# Patient Record
Sex: Male | Born: 1990 | Race: White | Hispanic: No | Marital: Married | Smoking: Current every day smoker
Health system: Southern US, Community
[De-identification: ages and names within clinical notes are randomized; demographics above are authoritative.]

## PROBLEM LIST (undated history)

## (undated) HISTORY — PX: VASECTOMY: SHX75

## (undated) HISTORY — PX: TONSILLECTOMY: SUR1361

## (undated) HISTORY — PX: ARTHROSCOPIC REPAIR ACL: SUR80

---
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
## 2008-10-31 ENCOUNTER — Emergency Department: Payer: Self-pay | Admitting: Emergency Medicine

## 2009-04-27 ENCOUNTER — Emergency Department: Payer: Self-pay | Admitting: Internal Medicine

## 2009-08-14 ENCOUNTER — Emergency Department (HOSPITAL_COMMUNITY): Admission: EM | Admit: 2009-08-14 | Discharge: 2009-08-15 | Payer: Self-pay | Admitting: Emergency Medicine
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
## 2010-01-25 ENCOUNTER — Emergency Department: Payer: Self-pay | Admitting: Emergency Medicine

## 2010-02-05 ENCOUNTER — Ambulatory Visit: Payer: Self-pay | Admitting: Unknown Physician Specialty

## 2011-01-27 LAB — URINALYSIS, ROUTINE W REFLEX MICROSCOPIC
Bilirubin Urine: NEGATIVE
Ketones, ur: NEGATIVE mg/dL
Specific Gravity, Urine: 1.01 (ref 1.005–1.030)

## 2011-01-27 LAB — POCT I-STAT, CHEM 8
Calcium, Ion: 1.1 mmol/L — ABNORMAL LOW (ref 1.12–1.32)
Chloride: 105 mEq/L (ref 96–112)
Creatinine, Ser: 0.9 mg/dL (ref 0.4–1.5)
Glucose, Bld: 97 mg/dL (ref 70–99)
Hemoglobin: 15.3 g/dL (ref 13.0–17.0)
Potassium: 3.4 mEq/L — ABNORMAL LOW (ref 3.5–5.1)
TCO2: 23 mmol/L (ref 0–100)

## 2014-06-30 ENCOUNTER — Emergency Department: Payer: Self-pay | Admitting: Emergency Medicine
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
## 2015-04-30 ENCOUNTER — Encounter (HOSPITAL_COMMUNITY): Payer: Self-pay | Admitting: Emergency Medicine

## 2015-04-30 ENCOUNTER — Emergency Department (HOSPITAL_COMMUNITY)
Admission: EM | Admit: 2015-04-30 | Discharge: 2015-04-30 | Disposition: A | Discharge status: Home / Self Care | Payer: BLUE CROSS/BLUE SHIELD | Attending: Emergency Medicine | Admitting: Emergency Medicine

## 2015-04-30 DIAGNOSIS — I889 Nonspecific lymphadenitis, unspecified: Secondary | ICD-10-CM | POA: Diagnosis not present

## 2015-04-30 DIAGNOSIS — Z720 Tobacco use: Secondary | ICD-10-CM | POA: Diagnosis not present

## 2015-04-30 DIAGNOSIS — R1903 Right lower quadrant abdominal swelling, mass and lump: Secondary | ICD-10-CM | POA: Diagnosis present

## 2015-04-30 MED ORDER — IBUPROFEN 800 MG PO TABS: 800.0000 mg | ORAL_TABLET | Freq: Three times a day (TID) | ORAL | Status: DC

## 2015-04-30 MED ORDER — DOXYCYCLINE HYCLATE 100 MG PO CAPS: 100.0000 mg | ORAL_CAPSULE | Freq: Two times a day (BID) | ORAL | Status: DC

## 2015-04-30 MED ORDER — IBUPROFEN 800 MG PO TABS
800.0000 mg | ORAL_TABLET | Freq: Once | ORAL | Status: AC
  Administered 2015-04-30: 800 mg via ORAL
  Filled 2015-04-30: qty 1

## 2015-04-30 MED ORDER — DOXYCYCLINE HYCLATE 100 MG PO TABS
100.0000 mg | ORAL_TABLET | Freq: Once | ORAL | Status: AC
  Administered 2015-04-30: 100 mg via ORAL
  Filled 2015-04-30: qty 1

## 2015-04-30 NOTE — ED Notes (Signed)
Pt states that he noticed a small lump in his rt groin area - states that he can feel a constant pain - radiates down into testicle

## 2015-04-30 NOTE — Discharge Instructions (Signed)
Recheck if the swelling persists after completion of your antibiotics. Recheck with your PCP, [facility], or Return here.  Lymphadenopathy Lymphadenopathy means "disease of the lymph glands." But the term is usually used to describe swollen or enlarged lymph glands, also called lymph nodes. These are the bean-shaped organs found in many locations including the neck, underarm, and groin. Lymph glands are part of the immune system, which fights infections in your body. Lymphadenopathy can occur in just one area of the body, such as the neck, or it can be generalized, with lymph node enlargement in several areas. The nodes found in the neck are the most common sites of lymphadenopathy. CAUSES When your immune system responds to germs (such as viruses or bacteria ), infection-fighting cells and fluid build up. This causes the glands to grow in size. Usually, this is not something to worry about. Sometimes, the glands themselves can become infected and inflamed. This is called lymphadenitis. Enlarged lymph nodes can be caused by many diseases:  Bacterial disease, such as strep throat or a skin infection.  Viral disease, such as a common cold.  Other germs, such as Lyme disease, tuberculosis, or sexually transmitted diseases.  Cancers, such as lymphoma (cancer of the lymphatic system) or leukemia (cancer of the white blood cells).  Inflammatory diseases such as lupus or rheumatoid arthritis.  Reactions to medications. Many of the diseases above are rare, but important. This is why you should see your caregiver if you have lymphadenopathy. SYMPTOMS  Swollen, enlarged lumps in the neck, back of the head, or other locations.  Tenderness.  Warmth or redness of the skin over the lymph nodes.  Fever. DIAGNOSIS Enlarged lymph nodes are often near the source of infection. They can help health care providers diagnose your illness. For instance:  Swollen lymph nodes around the jaw might be  caused by an infection in the mouth.  Enlarged glands in the neck often signal a throat infection.  Lymph nodes that are swollen in more than one area often indicate an illness caused by a virus. Your caregiver will likely know what is causing your lymphadenopathy after listening to your history and examining you. Blood tests, x-rays, or other tests may be needed. If the cause of the enlarged lymph node cannot be found, and it does not go away by itself, then a biopsy may be needed. Your caregiver will discuss this with you. TREATMENT Treatment for your enlarged lymph nodes will depend on the cause. Many times the nodes will shrink to normal size by themselves, with no treatment. Antibiotics or other medicines may be needed for infection. Only take over-the-counter or prescription medicines for pain, discomfort, or fever as directed by your caregiver. HOME CARE INSTRUCTIONS Swollen lymph glands usually return to normal when the underlying medical condition goes away. If they persist, contact your health-care provider. He/she might prescribe antibiotics or other treatments, depending on the diagnosis. Take any medications exactly as prescribed. Keep any follow-up appointments made to check on the condition of your enlarged nodes. SEEK MEDICAL CARE IF:  Swelling lasts for more than two weeks.  You have symptoms such as weight loss, night sweats, fatigue, or fever that does not go away.  The lymph nodes are hard, seem fixed to the skin, or are growing rapidly.  Skin over the lymph nodes is red and inflamed. This could mean there is an infection. SEEK IMMEDIATE MEDICAL CARE IF:  Fluid starts leaking from the area of the enlarged lymph node.  You  develop a fever of 102 F (38.9 C) or greater.  Severe pain develops (not necessarily at the site of a large lymph node).  You develop chest pain or shortness of breath.  You develop worsening abdominal pain. MAKE SURE YOU:  Understand these  instructions.  Will watch your condition.  Will get help right away if you are not doing well or get worse. Document Released: 07/19/2008 Document Revised: 02/24/2014 Document Reviewed: 07/19/2008 [facility] Patient Information 2015 [place], [place]. This information is not intended to replace advice given to you by your health care provider. Make sure you discuss any questions you have with your health care provider.

## 2015-04-30 NOTE — ED Provider Notes (Signed)
CSN: [id number]     Arrival date & time 04/30/15  1850 History   First MD Initiated Contact with Patient 04/30/15 1903     Chief Complaint  Patient presents with  . Groin Swelling      HPI  She presents for evaluation of a tender lump in his right inguinal region. States is been present for about a week slowly getting larger. He states is "still not very vague minutes tender". He states it is mobile under the skin. He has no testicular or penile pain no dysuria. No rash lesions vesicles or other symptoms. He does shave his genitals. He did this about 8 days ago. He has done it before and never had difficulties. No right leg wounds infection redness or symptoms. No other areas of swelling. No constitutional symptoms.  History reviewed. No pertinent past medical history. Past Surgical History  Procedure Laterality Date  . Arthroscopic repair acl    . Tonsillectomy     History reviewed. No pertinent family history. History  Substance Use Topics  . Smoking status: Current Every Day Smoker -- 1.00 packs/day    Types: Cigarettes  . Smokeless tobacco: Current User    Types: Snuff  . Alcohol Use: Yes     Comment: occassionally    Review of Systems  Constitutional: Negative for fever, chills, diaphoresis, appetite change and fatigue.  HENT: Negative for mouth sores, sore throat and trouble swallowing.   Eyes: Negative for visual disturbance.  Respiratory: Negative for cough, chest tightness, shortness of breath and wheezing.   Cardiovascular: Negative for chest pain.  Gastrointestinal: Negative for nausea, vomiting, abdominal pain, diarrhea and abdominal distention.  Endocrine: Negative for polydipsia, polyphagia and polyuria.  Genitourinary: Negative for dysuria, frequency and hematuria.       Tender lump right inguinal region.  Musculoskeletal: Negative for gait problem.  Skin: Negative for color change, pallor and rash.  Neurological: Negative for dizziness, syncope,  light-headedness and headaches.  Hematological: Does not bruise/bleed easily.  Psychiatric/Behavioral: Negative for behavioral problems and confusion.      Allergies  Review of patient's allergies indicates no known allergies.  Home Medications   Prior to Admission medications   Medication Sig Start Date End Date Taking? Authorizing Provider  doxycycline (VIBRAMYCIN) 100 MG capsule Take 1 capsule (100 mg total) by mouth 2 (two) times daily. 04/30/15   Rolland Porter, MD  ibuprofen (ADVIL,MOTRIN) 800 MG tablet Take 1 tablet (800 mg total) by mouth 3 (three) times daily. 04/30/15   Rolland Porter, MD   BP 145/76 mmHg  Pulse 108  Temp(Src) 98.2 F (36.8 C) (Oral)  Resp 16  Ht  (1.727 m)  Wt 180 lb (81.647 kg)  BMI 27.38 kg/m2  SpO2 100% Physical Exam  Constitutional: He is oriented to person, place, and time. He appears well-developed and well-nourished. No distress.  HENT:  Head: Normocephalic.  Eyes: Conjunctivae are normal. Pupils are equal, round, and reactive to light. No scleral icterus.  Neck: Normal range of motion. Neck supple. No thyromegaly present.  Cardiovascular: Normal rate and regular rhythm.  Exam reveals no gallop and no friction rub.   No murmur heard. Pulmonary/Chest: Effort normal and breath sounds normal. No respiratory distress. He has no wheezes. He has no rales.  Abdominal: Soft. Bowel sounds are normal. He exhibits no distension. There is no tenderness. There is no rebound.  Genitourinary:     Musculoskeletal: Normal range of motion.  Neurological: He is alert and oriented to person, place, and  time.  Skin: Skin is warm and dry. No rash noted.  Psychiatric: He has a normal mood and affect. His behavior is normal.    ED Course  Procedures (including critical care time) Labs Review Labs Reviewed - No data to display  Imaging Review No results found.   EKG Interpretation None      MDM   Final diagnoses:  Inguinal lymphadenitis    Symptoms  may be secondary to his shaving. No obvious infection. Plan doxycycline, ibuprofen. Recheck if not resolved upon completion of Biaxin 7-10 days.    Rolland PorterMark Nichelle Renwick, MD 04/30/15 [phone]

## 2016-12-01 DIAGNOSIS — K2900 Acute gastritis without bleeding: Secondary | ICD-10-CM | POA: Diagnosis not present

## 2016-12-01 DIAGNOSIS — K219 Gastro-esophageal reflux disease without esophagitis: Secondary | ICD-10-CM | POA: Diagnosis not present

## 2017-03-07 DIAGNOSIS — K625 Hemorrhage of anus and rectum: Secondary | ICD-10-CM | POA: Diagnosis not present

## 2017-11-09 DIAGNOSIS — J0100 Acute maxillary sinusitis, unspecified: Secondary | ICD-10-CM | POA: Diagnosis not present

## 2017-11-09 DIAGNOSIS — J9801 Acute bronchospasm: Secondary | ICD-10-CM | POA: Diagnosis not present

## 2017-11-09 DIAGNOSIS — R05 Cough: Secondary | ICD-10-CM | POA: Diagnosis not present

## 2017-11-17 DIAGNOSIS — Z0000 Encounter for general adult medical examination without abnormal findings: Secondary | ICD-10-CM | POA: Diagnosis not present

## 2017-11-18 DIAGNOSIS — Z720 Tobacco use: Secondary | ICD-10-CM | POA: Diagnosis not present

## 2017-11-18 DIAGNOSIS — Z0000 Encounter for general adult medical examination without abnormal findings: Secondary | ICD-10-CM | POA: Diagnosis not present

## 2018-09-28 DIAGNOSIS — J019 Acute sinusitis, unspecified: Secondary | ICD-10-CM | POA: Diagnosis not present

## 2018-12-24 DIAGNOSIS — M545 Low back pain: Secondary | ICD-10-CM | POA: Diagnosis not present

## 2018-12-24 DIAGNOSIS — M25562 Pain in left knee: Secondary | ICD-10-CM | POA: Diagnosis not present

## 2019-12-04 ENCOUNTER — Other Ambulatory Visit: Payer: Self-pay

## 2019-12-04 ENCOUNTER — Ambulatory Visit: Payer: BLUE CROSS/BLUE SHIELD | Attending: Internal Medicine

## 2019-12-04 DIAGNOSIS — Z20822 Contact with and (suspected) exposure to covid-19: Secondary | ICD-10-CM

## 2019-12-05 LAB — NOVEL CORONAVIRUS, NAA: SARS-CoV-2, NAA: NOT DETECTED

## 2019-12-06 ENCOUNTER — Other Ambulatory Visit: Payer: BLUE CROSS/BLUE SHIELD

## 2020-11-24 ENCOUNTER — Encounter: Payer: Self-pay | Admitting: Urology

## 2020-11-24 ENCOUNTER — Other Ambulatory Visit: Payer: Self-pay

## 2020-11-24 ENCOUNTER — Ambulatory Visit (INDEPENDENT_AMBULATORY_CARE_PROVIDER_SITE_OTHER): Payer: 59 | Admitting: Urology

## 2020-11-24 VITALS — BP 131/89 | HR 98 | Ht 68.0 in | Wt 215.0 lb

## 2020-11-24 DIAGNOSIS — Z3009 Encounter for other general counseling and advice on contraception: Secondary | ICD-10-CM

## 2020-11-24 MED ORDER — DIAZEPAM 10 MG PO TABS: 10.0000 mg | ORAL_TABLET | Freq: Once | ORAL | 0 refills | Status: AC

## 2020-11-24 NOTE — Progress Notes (Signed)
11/24/2020 1:11 PM   Andrew Archer 01-20-1991 [id number]  Referring provider: Benita Stabile, MD [address] [place],  [place] [address]  Chief Complaint  Patient presents with  . VAS Consult    HPI: 30 y.o. year old male referred for further evaluation of possible vasectomy.  Denies a history of testicular trauma or pain.  No urinary issues.  No previous scrotal surgeries.  He and his wife are both anxious for him to have a vasectomy.  They have a 80 year old stepson, 34-year-old daughter and a 10-week-old newborn.  He would like to have the vasectomy done while he still on maternity leave.   PMH: History reviewed. No pertinent past medical history.  Surgical History: Past Surgical History:  Procedure Laterality Date  . ARTHROSCOPIC REPAIR ACL    . TONSILLECTOMY      Home Medications:  Allergies as of 11/24/2020   No Known Allergies     Medication List       Accurate as of November 24, 2020  1:11 PM. If you have any questions, ask your nurse or doctor.        STOP taking these medications   doxycycline 100 MG capsule Commonly known as: VIBRAMYCIN Stopped by: [name], MD   ibuprofen 800 MG tablet Commonly known as: ADVIL Stopped by: [name], MD       Allergies: No Known Allergies  Family History: History reviewed. No pertinent family history.  Social History:  reports that he has been smoking cigarettes. He has been smoking about 1.00 pack per day. His smokeless tobacco use includes snuff. He reports current alcohol use. He reports that he does not use drugs.   Physical Exam: BP 131/89   Pulse 98   Ht 5\' 8"  (1.727 m)   Wt 215 lb (97.5 kg)   BMI 32.69 kg/m   Constitutional:  Alert and oriented, No acute distress. HEENT: [place] AT, moist mucus membranes.  Trachea midline, no masses. Cardiovascular: No clubbing, cyanosis, or edema. Respiratory: Normal respiratory effort, no increased work of breathing. GI: Abdomen is soft, nontender,  nondistended, no abdominal masses GU: Normal phallus.  Bilateral descended testicles without masses.  Vasa easily palpable bilaterally. Skin: No rashes, bruises or suspicious lesions. Neurologic: Grossly intact, no focal deficits, moving all 4 extremities. Psychiatric: Normal mood and affect.  Laboratory Data: N/a  Urinalysis n/a  Pertinent Imaging: N/a  Assessment & Plan:    1. Vasectomy evaluation Today, we discussed what the vas deferens is, where it is located, and its function. We reviewed the procedure for vasectomy, it's risks, benefits, alternatives, and likelihood of achieving his goals. We discussed in detail the procedure, complications, and recovery as well as the need for clearance prior to unprotected intercourse. We discussed that vasectomy does not protect against sexually transmitted diseases. We discussed that this procedure does not result in immediate sterility and that they would need to use other forms of birth control until he has been cleared with negative postvasectomy semen analyses. I explained that the procedure is considered to be permanent and that attempts at reversal have varying degrees of success. These options include vasectomy reversal, sperm retrieval, and in vitro fertilization; these can be very expensive. We discussed the chance of postvasectomy pain syndrome which occurs in less than 5% of patients. I explained to the patient that there is no treatment to resolve this chronic pain, and that if it developed I would not be able to help resolve the issue, but  that surgery is generally not needed for correction. I explained there have even been reports of systemic like illness associated with this chronic pain, and that there was no good cure. I explained that vasectomy it is not a 100% reliable form of birth control, and the risk of pregnancy after vasectomy is approximately 1 in 2000 men who had a negative postvasectomy semen analysis or rare non-motile sperm.  I explained that repeat vasectomy was necessary in less than 1% of vasectomy procedures when employing the type of technique that I use. I explained that he should refrain from ejaculation for approximately one week following vasectomy. I explained that there are other options for birth control which are permanent and non-permanent; we discussed these. I explained the rates of surgical complications, such as symptomatic hematoma or infection, are low (1-2%) and vary with the surgeon's experience and criteria used to diagnose the complication.   The patient had the opportunity to ask questions to his stated satisfaction. He voiced understanding of the above factors and stated that he has read all the information provided to him and the packets and informed consent.  He is interested in receiving of Valium 10 mg prior to the procedure for the purpose of anxiolysis.  A prescription was given today.  He will have a driver on the day of the procedure.   [name], MD  [facility] [address] [place], [place] [address] [phone]

## 2020-11-24 NOTE — Patient Instructions (Signed)

## 2020-12-15 ENCOUNTER — Encounter: Payer: Self-pay | Admitting: Urology

## 2020-12-15 ENCOUNTER — Other Ambulatory Visit: Payer: Self-pay

## 2020-12-15 ENCOUNTER — Ambulatory Visit: Payer: 59 | Admitting: Urology

## 2020-12-15 VITALS — BP 139/90 | HR 82 | Ht 68.0 in | Wt 215.0 lb

## 2020-12-15 DIAGNOSIS — Z9852 Vasectomy status: Secondary | ICD-10-CM

## 2020-12-15 NOTE — Progress Notes (Signed)
12/15/20  CC:  Chief Complaint  Patient presents with  . VAS    HPI: 30 year old male presenting for vasectomy.  Consent and timeout performed.  All additional questions answered.  Blood pressure 139/90, pulse 82, height 5\' 8"  (1.727 m), weight 215 lb (97.5 kg). NED. A&Ox3.   No respiratory distress   Abd soft, NT, ND Normal external genitalia with patent urethral meatus   Bilateral Vasectomy Procedure  Pre-Procedure: - Patient's scrotum was prepped and draped for vasectomy. - The vas was palpated through the scrotal skin on the left. - 1% Xylocaine was injected into the skin and surrounding tissue for placement  - In a similar manner, the vas on the right was identified, anesthetized, and stabilized.  Procedure: - A #11 blade was used to make a small stab incision in the skin overlying the vas - The left vas was isolated and brought up through the incision exposing that structure. - Bleeding points were cauterized as they occurred. - The vas was free from the surrounding structures and brought to the view. - A segment was positioned for placement with a hemostat. - A second hemostat was placed and a small segment between the two hemostats and was removed for inspection. - Each end of the transected vas lumen was fulgurated/ obliterated using needlepoint electrocautery -A fascial interposition was performed on testicular end of the vas using #3-0 chromic suture -The same procedure was performed on the right. - A single suture of #3-0 chromic catgut was used to close each lateral scrotal skin incision - A dressing was applied.  Post-Procedure: - Patient was instructed in care of the operative area - A specimen is to be delivered in 12 weeks   -Another form of contraception is to be used until post vasectomy semen analysis  , MD

## 2020-12-15 NOTE — Patient Instructions (Signed)

## 2020-12-31 ENCOUNTER — Encounter: Payer: Self-pay | Admitting: Physician Assistant

## 2020-12-31 ENCOUNTER — Ambulatory Visit (INDEPENDENT_AMBULATORY_CARE_PROVIDER_SITE_OTHER): Payer: 59 | Admitting: Physician Assistant

## 2020-12-31 ENCOUNTER — Other Ambulatory Visit: Payer: Self-pay

## 2020-12-31 VITALS — BP 153/101 | HR 79 | Ht 68.0 in | Wt 230.0 lb

## 2020-12-31 DIAGNOSIS — N5089 Other specified disorders of the male genital organs: Secondary | ICD-10-CM

## 2020-12-31 LAB — URINALYSIS, COMPLETE
Bilirubin, UA: NEGATIVE
Glucose, UA: NEGATIVE
Ketones, UA: NEGATIVE
Leukocytes,UA: NEGATIVE
Nitrite, UA: NEGATIVE
Protein,UA: NEGATIVE
RBC, UA: NEGATIVE
Specific Gravity, UA: 1.025 (ref 1.005–1.030)
Urobilinogen, Ur: 0.2 mg/dL (ref 0.2–1.0)
pH, UA: 5.5 (ref 5.0–7.5)

## 2020-12-31 LAB — MICROSCOPIC EXAMINATION
Bacteria, UA: NONE SEEN
RBC: NONE SEEN /hpf (ref 0–2)
WBC, UA: NONE SEEN /hpf (ref 0–5)

## 2020-12-31 NOTE — Patient Instructions (Addendum)
Take ibuprofen 400mg  every 6 to 8 hours and continue icing the area as needed for management of your sperm granuloma.  Scrotal Swelling Scrotal swelling is a condition in which the sac of skin that contains the testicles, blood vessels, and structures that help deliver sperm and semen (scrotum) is enlarged or swollen. This can happen on one or both sides of the scrotum. Many things can cause the scrotum to enlarge or swell, including:  Fluid around the testicle (hydrocele).  A weakened area in the muscles around the groin (hernia).  An enlarged vein around the testicle.  An injury.  An infection.  Certain medical treatments.  Certain medical conditions, such as congestive heart failure.  A recent genital surgery or procedure.  A twisting of the spermatic cord that cuts off blood supply (testicular torsion).  Testicular cancer. Scrotal swelling can happen along with scrotal pain. Follow these instructions at home: Activity  Rest as told by your health care provider. The best position is to lie down.  Do not lift anything that is heavier than 5 lb (2.3 kg), or the limit that you are told, until your health care provider says that it is safe.  Avoid sexual activity until your health care provider says that it is safe. General instructions  Take over-the-counter and prescription medicines only as told by your health care provider.  Perform a monthly self-exam of the scrotum and penis. Feel for changes. Ask your health care provider how to perform a monthly self-exam if you are unsure.  Keep all follow-up visits. This is important. Managing pain, stiffness, and swelling  If directed, put ice on the affected area. To do this: ? Put ice in a plastic bag. ? Place a towel between your skin and the bag. ? Leave the ice on for 20 minutes, 2-3 times a day. ? Remove the ice if your skin turns bright red. This is very important. If you cannot feel pain, heat, or cold, you have a  greater risk of damage to the area.  Place a rolled towel under your testicles for support or use underwear with a supportive pouch.  Wear an athletic support cup or scrotal support, such as a jock strap, for comfort. Contact a health care provider if:  You have sudden pain that is persistent and does not improve.  You have a heavy feeling or notice fluid in the scrotum.  You have pain or burning while urinating.  You have blood in your urine or semen.  You feel a lump around the testicle.  You notice that one testicle is larger than the other. Keep in mind that a small difference in size is normal.  You have a persistent dull ache or pain in your groin or scrotum. Get help right away if:  The pain does not go away.  The pain becomes severe.  You have a fever or chills.  You have pain or vomiting that cannot be controlled.  One or both sides of the scrotum are very red and swollen.  There is redness spreading upward from your scrotum to your abdomen or downward from your scrotum to your thighs. Summary  Scrotal swelling is a condition in which the sac of skin that contains the testicles, blood vessels, and structures that help deliver the sperm and semen (scrotum) is enlarged or swollen.  Many things can cause the scrotum to swell, including fluid around the testicle (hydrocele), a weakened area in the muscles around the groin (hernia), and an enlarged  vein around the testicle.  Icing the scrotum or using underwear with a supportive pouch may help reduce swelling and pain.  Contact a health care provider if you develop scrotal pain that is sudden and persistent, you have pain while urinating, you feel a lump around the testicle, or you notice blood in your urine or semen.  Get help right away if you have uncontrolled pain or vomiting, a very red and swollen scrotum, or a fever or chills. This information is not intended to replace advice given to you by your health care  provider. Make sure you discuss any questions you have with your health care provider. Document Revised: 06/09/2020 Document Reviewed: 06/09/2020 Elsevier Patient Education  2021 [employer].

## 2020-12-31 NOTE — Progress Notes (Unsigned)
   12/31/2020 10:25 AM   Retta Diones [birth year] [id number]  CC: Chief Complaint  Patient presents with  . Testicular Mass   HPI: Andrew Archer is a 30 y.o. male s/p vasectomy with Dr. Apolinar Junes 16 days ago who presents today for evaluation of a testicular mass.  Today he reports appearance of a new, tender knot above his left testicle approximately 1 week ago.  He states he has resumed sexual activity just prior to this.  He denies testicular swelling, dysuria, and penile discharge.  He has been using ice on the affected area with significant improvement.  In-office UA and microscopy today pan negative.  PMH: No past medical history on file.  Surgical History: Past Surgical History:  Procedure Laterality Date  . ARTHROSCOPIC REPAIR ACL    . TONSILLECTOMY      Home Medications:  Allergies as of 12/31/2020   No Known Allergies     Medication List    as of December 31, 2020 10:25 AM   You have not been prescribed any medications.     Allergies:  No Known Allergies  Family History: No family history on file.  Social History:   reports that he has been smoking cigarettes. He has been smoking about 1.00 pack per day. His smokeless tobacco use includes snuff. He reports current alcohol use. He reports that he does not use drugs.  Physical Exam: BP (!) 153/101   Pulse 79   Ht 5\' 8"  (1.727 m)   Wt 230 lb (104.3 kg)   BMI 34.97 kg/m   Constitutional:  Alert and oriented, no acute distress, nontoxic appearing HEENT: [place], AT Cardiovascular: No clubbing, cyanosis, or edema Respiratory: Normal respiratory effort, no increased work of breathing GU: Focal tenderness and induration located superior the left testicle Skin: No rashes, bruises or suspicious lesions Neurologic: Grossly intact, no focal deficits, moving all 4 extremities Psychiatric: Normal mood and affect  Laboratory Data: Results for orders placed or performed in visit on 12/31/20  Microscopic Examination    Urine  Result Value Ref Range   WBC, UA None seen 0 - 5 /hpf   RBC None seen 0 - 2 /hpf   Epithelial Cells (non renal) 0-10 0 - 10 /hpf   Mucus, UA Present Not Estab.   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  Reassured patient that this is an inflammatory response to leaking seminal fluid.  Counseled him to use cryotherapy and ibuprofen for management of inflammation.  Explained that this will be self-limited.  He expressed understanding. - Urinalysis, Complete   Return if symptoms worsen or fail to improve.  03/02/21, PA-C  [facility] [address], [address] [place], [place] [phone]

## 2021-03-15 ENCOUNTER — Other Ambulatory Visit: Payer: Self-pay

## 2021-03-18 ENCOUNTER — Other Ambulatory Visit: Payer: 59

## 2021-03-18 ENCOUNTER — Other Ambulatory Visit: Payer: Self-pay

## 2021-03-18 DIAGNOSIS — Z9852 Vasectomy status: Secondary | ICD-10-CM

## 2021-03-19 LAB — POST-VAS SPERM EVALUATION,QUAL: Volume: 1 mL

## 2021-03-23 ENCOUNTER — Telehealth: Payer: Self-pay | Admitting: *Deleted

## 2021-03-23 ENCOUNTER — Other Ambulatory Visit: Payer: Self-pay

## 2021-03-23 DIAGNOSIS — Z9852 Vasectomy status: Secondary | ICD-10-CM

## 2021-03-23 NOTE — Telephone Encounter (Signed)
Left patient a VM with details-made lab appointment. Asked to return call with any questions.

## 2021-03-23 NOTE — Telephone Encounter (Signed)
-----   Message from [name], MD sent at 03/23/2021  8:24 AM EDT ----- Sperm still present.  This does not necessarily mean the procedure is a failure.  Generally, there can still be sperm present in low numbers or immotile sperm which will turn the test positive.  Please repeat test in 1 month.  Encourage at least 10 ejaculations in the interim.  Continue to use backup birth control.  [name], MD

## 2021-04-22 ENCOUNTER — Other Ambulatory Visit: Payer: Self-pay

## 2021-04-29 ENCOUNTER — Other Ambulatory Visit: Payer: Self-pay

## 2021-05-03 ENCOUNTER — Other Ambulatory Visit: Payer: 59

## 2021-05-03 ENCOUNTER — Other Ambulatory Visit: Payer: Self-pay

## 2021-05-03 DIAGNOSIS — Z9852 Vasectomy status: Secondary | ICD-10-CM

## 2021-05-05 ENCOUNTER — Telehealth: Payer: Self-pay | Admitting: *Deleted

## 2021-05-05 LAB — POST-VAS SPERM EVALUATION,QUAL: Volume: 1.7 mL

## 2021-05-05 NOTE — Telephone Encounter (Addendum)
Patient informed, voiced understanding.  ----- Message from [name], MD sent at 05/05/2021  8:33 AM EDT ----- There is now no longer any sperm present, great news.  You may now rely on this as your primary form of birth control.  [name], MD

## 2021-06-24 ENCOUNTER — Telehealth: Payer: 59 | Admitting: Urology

## 2021-06-24 NOTE — Telephone Encounter (Signed)
This is likely unrelated to his vasectomy.  He is welcome to schedule an appointment with either myself or one of the PAs to discuss options for premature ejaculation.  [name], MD

## 2021-06-24 NOTE — Telephone Encounter (Signed)
Pt called office and says ever since his vasectomy (11/2020), he's ejaculating faster and sooner than before.  He wants to know if there are any meds or anything he could try.

## 2021-06-24 NOTE — Telephone Encounter (Signed)
Pt called office again.  I read message from Dr Apolinar Junes and pt scheduled appt to discuss his issues.

## 2021-07-13 NOTE — Progress Notes (Incomplete)
   07/13/21 4:51 PM   Andrew Archer 08-Apr-1991 [id number]  Referring provider:  Benita Stabile, MD [address] [name],  [place] [address] No chief complaint on file.    HPI: Andrew Archer is a 30 y.o.male with personal history of a vasectomy and sperm granuloma, who presents today for further discussion and evaluation of premature ejaculation.   He is s/p vasectomy on 12/15/2020.         PMH: No past medical history on file.  Surgical History: Past Surgical History:  Procedure Laterality Date   ARTHROSCOPIC REPAIR ACL     TONSILLECTOMY      Home Medications:  Allergies as of 07/14/2021   No Known Allergies      Medication List    as of July 13, 2021  4:51 PM   You have not been prescribed any medications.     Allergies: No Known Allergies  Family History: No family history on file.  Social History:  reports that he has been smoking cigarettes. He has been smoking an average of 1 pack per day. His smokeless tobacco use includes snuff. He reports current alcohol use. He reports that he does not use drugs.   Physical Exam: There were no vitals taken for this visit.  Constitutional:  Alert and oriented, No acute distress. HEENT:  AT, moist mucus membranes.  Trachea midline, no masses. Cardiovascular: No clubbing, cyanosis, or edema. Respiratory: Normal respiratory effort, no increased work of breathing. GI: Abdomen is soft, nontender, nondistended, no abdominal masses GU: No CVA tenderness Lymph: No cervical or inguinal lymphadenopathy. Skin: No rashes, bruises or suspicious lesions. Neurologic: Grossly intact, no focal deficits, moving all 4 extremities. Psychiatric: Normal mood and affect.  Laboratory Data:  Lab Results  Component Value Date   CREATININE 0.9 08/14/2009    Urinalysis   Pertinent Imaging:    Assessment & Plan:     No follow-ups on file.  I,Kailey Littlejohn,acting as a Neurosurgeon for Andrew Scotland, MD.,have  documented all relevant documentation on the behalf of Andrew Scotland, MD,as directed by  Andrew Scotland, MD while in the presence of Andrew Scotland, MD.   [facility] [address] [place], [place] [address] [phone]

## 2021-07-14 ENCOUNTER — Ambulatory Visit: Payer: 59 | Admitting: Urology

## 2024-02-02 ENCOUNTER — Ambulatory Visit: Admitting: Family Medicine

## 2024-02-02 ENCOUNTER — Encounter: Payer: Self-pay | Admitting: Family Medicine

## 2024-02-02 VITALS — BP 131/82 | HR 90 | Ht 68.0 in | Wt 223.0 lb

## 2024-02-02 DIAGNOSIS — Z23 Encounter for immunization: Secondary | ICD-10-CM

## 2024-02-02 DIAGNOSIS — I10 Essential (primary) hypertension: Secondary | ICD-10-CM | POA: Diagnosis not present

## 2024-02-02 LAB — CBC WITH DIFFERENTIAL/PLATELET
Basophils Absolute: 0.1 10*3/uL (ref 0.0–0.2)
Basos: 1 %
EOS (ABSOLUTE): 0.1 10*3/uL (ref 0.0–0.4)
Eos: 2 %
Hematocrit: 47.3 % (ref 37.5–51.0)
Hemoglobin: 16.1 g/dL (ref 13.0–17.7)
Immature Grans (Abs): 0 10*3/uL (ref 0.0–0.1)
Immature Granulocytes: 0 %
Lymphocytes Absolute: 1.7 10*3/uL (ref 0.7–3.1)
Lymphs: 25 %
MCH: 29.9 pg (ref 26.6–33.0)
MCHC: 34 g/dL (ref 31.5–35.7)
MCV: 88 fL (ref 79–97)
Monocytes Absolute: 0.6 10*3/uL (ref 0.1–0.9)
Monocytes: 8 %
Neutrophils Absolute: 4.4 10*3/uL (ref 1.4–7.0)
Neutrophils: 64 %
Platelets: 298 10*3/uL (ref 150–450)
RBC: 5.39 x10E6/uL (ref 4.14–5.80)
RDW: 12 % (ref 11.6–15.4)
WBC: 6.8 10*3/uL (ref 3.4–10.8)

## 2024-02-02 LAB — LIPID PANEL
Chol/HDL Ratio: 3.3 ratio (ref 0.0–5.0)
Cholesterol, Total: 157 mg/dL (ref 100–199)
HDL: 48 mg/dL (ref >39)
LDL Chol Calc (NIH): 94 mg/dL (ref 0–99)
Triglycerides: 80 mg/dL (ref 0–149)
VLDL Cholesterol Cal: 15 mg/dL (ref 5–40)

## 2024-02-02 LAB — CMP14+EGFR
ALT: 24 IU/L (ref 0–44)
AST: 22 IU/L (ref 0–40)
Albumin: 4.5 g/dL (ref 4.1–5.1)
Alkaline Phosphatase: 82 IU/L (ref 44–121)
BUN/Creatinine Ratio: 8 — ABNORMAL LOW (ref 9–20)
BUN: 9 mg/dL (ref 6–20)
Bilirubin Total: 0.5 mg/dL (ref 0.0–1.2)
CO2: 23 mmol/L (ref 20–29)
Calcium: 9.3 mg/dL (ref 8.7–10.2)
Chloride: 101 mmol/L (ref 96–106)
Creatinine, Ser: 1.14 mg/dL (ref 0.76–1.27)
Globulin, Total: 2.1 g/dL (ref 1.5–4.5)
Glucose: 82 mg/dL (ref 70–99)
Potassium: 4.4 mmol/L (ref 3.5–5.2)
Sodium: 139 mmol/L (ref 134–144)
Total Protein: 6.6 g/dL (ref 6.0–8.5)
eGFR: 87 mL/min/1.73

## 2024-02-02 MED ORDER — AMLODIPINE BESYLATE 5 MG PO TABS: 5.0000 mg | ORAL_TABLET | Freq: Every day | ORAL | 3 refills | Status: DC

## 2024-02-02 NOTE — Progress Notes (Signed)
 BP 131/82   Pulse 90   Ht 5\' 8"  (1.727 m)   Wt 223 lb (101.2 kg)   SpO2 98%   BMI 33.91 kg/m    Subjective:    Patient ID: Andrew Archer, male    DOB: 02-17-1991, 33 y.o.   MRN: [id number]  HPI: Andrew Archer is a 33 y.o. male presenting on 02/02/2024 for Establish Care   HPI Hypertension Patient is currently on amlodipine, and their blood pressure today is 131/82. Patient denies any lightheadedness or dizziness. Patient denies headaches, blurred vision, chest pains, shortness of breath, or weakness. Denies any side effects from medication and is content with current medication.   Relevant past medical, surgical, family and social history reviewed and updated as indicated. Interim medical history since our last visit reviewed. Allergies and medications reviewed and updated.  Review of Systems  Constitutional:  Negative for chills and fever.  Eyes:  Negative for visual disturbance.  Respiratory:  Negative for shortness of breath and wheezing.   Cardiovascular:  Negative for chest pain and leg swelling.  Musculoskeletal:  Negative for back pain and gait problem.  Skin:  Negative for rash.  Neurological:  Negative for dizziness and light-headedness.  All other systems reviewed and are negative.   Per HPI unless specifically indicated above  Social History   Socioeconomic History   Marital status: Married    Spouse name: Not on file   Number of children: Not on file   Years of education: Not on file   Highest education level: 12th grade  Occupational History   Not on file  Tobacco Use   Smoking status: Every Day    Current packs/day: 1.00    Types: E-cigarettes, Cigarettes   Smokeless tobacco: Current    Types: Snuff  Substance and Sexual Activity   Alcohol use: Yes    Comment: occassionally   Drug use: No   Sexual activity: Yes    Partners: Female  Other Topics Concern   Not on file  Social History Narrative   Not on file   Social Drivers of Health    Financial Resource Strain: Patient Declined (02/01/2024)   Overall Financial Resource Strain (CARDIA)    Difficulty of Paying Living Expenses: Patient declined  Food Insecurity: Patient Declined (02/01/2024)   Hunger Vital Sign    Worried About Running Out of Food in the Last Year: Patient declined    Ran Out of Food in the Last Year: Patient declined  Transportation Needs: No Transportation Needs (02/01/2024)   PRAPARE - Administrator, Civil Service (Medical): No    Lack of Transportation (Non-Medical): No  Physical Activity: Insufficiently Active (02/01/2024)   Exercise Vital Sign    Days of Exercise per Week: 4 days    Minutes of Exercise per Session: 30 min  Stress: Stress Concern Present (02/01/2024)   [employer] of Occupational Health - Occupational Stress Questionnaire    Feeling of Stress : Rather much  Social Connections: Moderately Isolated (02/01/2024)   Social Connection and Isolation Panel [NHANES]    Frequency of Communication with Friends and Family: More than three times a week    Frequency of Social Gatherings with Friends and Family: More than three times a week    Attends Religious Services: Never    Database administrator or Organizations: No    Attends Engineer, structural: Not on file    Marital Status: Married  Catering manager Violence: Not on file  Past Surgical History:  Procedure Laterality Date   ARTHROSCOPIC REPAIR ACL     TONSILLECTOMY     VASECTOMY Bilateral     Family History  Problem Relation Age of Onset   Lumbar disc disease Father    Cancer Maternal Grandfather        prostate cancer    Allergies as of 02/02/2024   No Known Allergies      Medication List        Accurate as of February 02, 2024 10:10 AM. If you have any questions, ask your nurse or doctor.          amLODipine 5 MG tablet Commonly known as: NORVASC Take 1 tablet (5 mg total) by mouth daily. Started by: Elige Radon Johany Hansman            Objective:    BP 131/82   Pulse 90   Ht 5\' 8"  (1.727 m)   Wt 223 lb (101.2 kg)   SpO2 98%   BMI 33.91 kg/m   Wt Readings from Last 3 Encounters:  02/02/24 223 lb (101.2 kg)  12/31/20 230 lb (104.3 kg)  12/15/20 215 lb (97.5 kg)    Physical Exam Vitals and nursing note reviewed.  Constitutional:      General: He is not in acute distress.    Appearance: He is well-developed. He is not diaphoretic.  Eyes:     General: No scleral icterus.    Conjunctiva/sclera: Conjunctivae normal.  Neck:     Thyroid: No thyromegaly.  Cardiovascular:     Rate and Rhythm: Normal rate and regular rhythm.     Heart sounds: Normal heart sounds. No murmur heard. Pulmonary:     Effort: Pulmonary effort is normal. No respiratory distress.     Breath sounds: Normal breath sounds. No wheezing.  Musculoskeletal:        General: No swelling. Normal range of motion.     Cervical back: Neck supple.  Lymphadenopathy:     Cervical: No cervical adenopathy.  Skin:    General: Skin is warm and dry.     Findings: No rash.  Neurological:     Mental Status: He is alert and oriented to person, place, and time.     Coordination: Coordination normal.  Psychiatric:        Behavior: Behavior normal.         Assessment & Plan:   Problem List Items Addressed This Visit       Cardiovascular and Mediastinum   Hypertension - Primary   Relevant Medications   amLODipine (NORVASC) 5 MG tablet   Other Relevant Orders   CBC with Differential/Platelet   CMP14+EGFR   Lipid panel   Other Visit Diagnoses       Encounter for immunization       Relevant Orders   Tdap vaccine greater than or equal to 7yo IM (Completed)       Will start amlodipine low-dose.  Check blood work today. Follow up plan: Return in about 3 months (around 05/03/2024), or if symptoms worsen or fail to improve, for Hypertension recheck.  Arville Care, MD [facility] 02/02/2024, 10:10  AM

## 2024-02-08 ENCOUNTER — Encounter: Payer: Self-pay | Admitting: Family Medicine

## 2024-02-08 NOTE — Progress Notes (Signed)
 Slight variation on BUN/creatinine, not concerning at this time. All other labs look good!

## 2024-02-22 ENCOUNTER — Ambulatory Visit: Payer: Self-pay | Admitting: Family Medicine

## 2024-05-06 ENCOUNTER — Ambulatory Visit: Admitting: Family Medicine

## 2024-05-06 ENCOUNTER — Encounter: Payer: Self-pay | Admitting: Family Medicine

## 2024-05-06 ENCOUNTER — Other Ambulatory Visit: Payer: Self-pay

## 2024-05-06 VITALS — BP 136/78 | HR 119 | Ht 68.0 in | Wt 219.0 lb

## 2024-05-06 DIAGNOSIS — I10 Essential (primary) hypertension: Secondary | ICD-10-CM | POA: Diagnosis not present

## 2024-05-06 MED ORDER — AMLODIPINE BESYLATE 10 MG PO TABS
10.0000 mg | ORAL_TABLET | Freq: Every day | ORAL | 1 refills | Status: DC
  Filled 2024-08-05: qty 90, 90d supply, fill #0

## 2024-05-06 MED ORDER — AMLODIPINE BESYLATE 10 MG PO TABS: 10.0000 mg | ORAL_TABLET | Freq: Every day | ORAL | 1 refills | Status: DC

## 2024-05-06 NOTE — Progress Notes (Signed)
 BP 136/78   Pulse (!) 119   Ht 5' 8 (1.727 m)   Wt 219 lb (99.3 kg)   SpO2 95%   BMI 33.30 kg/m    Subjective:   Patient ID: Andrew Archer, male    DOB: Dec 01, 1990, 33 y.o.   MRN: [id number]  HPI: Andrew Archer is a 33 y.o. male presenting on 05/06/2024 for Medical Management of Chronic Issues and Hypertension   HPI Hypertension Patient is currently on amlodipine , and their blood pressure today is 136/78 he says it runs in the 130s to 140s over 90s at home, so the diastolic is still above the 90 the majority of the time.. Patient denies any lightheadedness or dizziness. Patient denies headaches, blurred vision, chest pains, shortness of breath, or weakness. Denies any side effects from medication and is content with current medication.   Relevant past medical, surgical, family and social history reviewed and updated as indicated. Interim medical history since our last visit reviewed. Allergies and medications reviewed and updated.  Review of Systems  Constitutional:  Negative for chills and fever.  Eyes:  Negative for visual disturbance.  Respiratory:  Negative for shortness of breath and wheezing.   Cardiovascular:  Negative for chest pain and leg swelling.  Musculoskeletal:  Negative for back pain and gait problem.  Skin:  Negative for rash.  All other systems reviewed and are negative.   Per HPI unless specifically indicated above   Allergies as of 05/06/2024   No Known Allergies      Medication List        Accurate as of May 06, 2024  1:28 PM. If you have any questions, ask your nurse or doctor.          amLODipine  10 MG tablet Commonly known as: NORVASC  Take 1 tablet (10 mg total) by mouth daily. What changed:  medication strength how much to take Changed by: Fonda LABOR Alzada Brazee         Objective:   BP 136/78   Pulse (!) 119   Ht 5' 8 (1.727 m)   Wt 219 lb (99.3 kg)   SpO2 95%   BMI 33.30 kg/m   Wt Readings from Last 3 Encounters:   05/06/24 219 lb (99.3 kg)  02/02/24 223 lb (101.2 kg)  12/31/20 230 lb (104.3 kg)    Physical Exam Vitals and nursing note reviewed.  Constitutional:      General: He is not in acute distress.    Appearance: He is well-developed. He is not diaphoretic.  Eyes:     General: No scleral icterus.    Conjunctiva/sclera: Conjunctivae normal.  Neck:     Thyroid: No thyromegaly.  Cardiovascular:     Rate and Rhythm: Normal rate and regular rhythm.     Heart sounds: Normal heart sounds. No murmur heard. Pulmonary:     Effort: Pulmonary effort is normal. No respiratory distress.     Breath sounds: Normal breath sounds. No wheezing.  Musculoskeletal:        General: Normal range of motion.  Skin:    General: Skin is warm and dry.     Findings: No rash.  Neurological:     Mental Status: He is alert and oriented to person, place, and time.     Coordination: Coordination normal.  Psychiatric:        Behavior: Behavior normal.     Results for orders placed or performed in visit on 02/02/24  CBC with Differential/Platelet   Collection  Time: 02/02/24 10:17 AM  Result Value Ref Range   WBC 6.8 3.4 - 10.8 x10E3/uL   RBC 5.39 4.14 - 5.80 x10E6/uL   Hemoglobin 16.1 13.0 - 17.7 g/dL   Hematocrit 52.6 62.4 - 51.0 %   MCV 88 79 - 97 fL   MCH 29.9 26.6 - 33.0 pg   MCHC 34.0 31.5 - 35.7 g/dL   RDW 87.9 88.3 - 84.5 %   Platelets 298 150 - 450 x10E3/uL   Neutrophils 64 Not Estab. %   Lymphs 25 Not Estab. %   Monocytes 8 Not Estab. %   Eos 2 Not Estab. %   Basos 1 Not Estab. %   Neutrophils Absolute 4.4 1.4 - 7.0 x10E3/uL   Lymphocytes Absolute 1.7 0.7 - 3.1 x10E3/uL   Monocytes Absolute 0.6 0.1 - 0.9 x10E3/uL   EOS (ABSOLUTE) 0.1 0.0 - 0.4 x10E3/uL   Basophils Absolute 0.1 0.0 - 0.2 x10E3/uL   Immature Granulocytes 0 Not Estab. %   Immature Grans (Abs) 0.0 0.0 - 0.1 x10E3/uL  CMP14+EGFR   Collection Time: 02/02/24 10:17 AM  Result Value Ref Range   Glucose 82 70 - 99 mg/dL   BUN 9  6 - 20 mg/dL   Creatinine, Ser 8.85 0.76 - 1.27 mg/dL   eGFR 87 >40 fO/fpw/8.26   BUN/Creatinine Ratio 8 (L) 9 - 20   Sodium 139 134 - 144 mmol/L   Potassium 4.4 3.5 - 5.2 mmol/L   Chloride 101 96 - 106 mmol/L   CO2 23 20 - 29 mmol/L   Calcium 9.3 8.7 - 10.2 mg/dL   Total Protein 6.6 6.0 - 8.5 g/dL   Albumin 4.5 4.1 - 5.1 g/dL   Globulin, Total 2.1 1.5 - 4.5 g/dL   Bilirubin Total 0.5 0.0 - 1.2 mg/dL   Alkaline Phosphatase 82 44 - 121 IU/L   AST 22 0 - 40 IU/L   ALT 24 0 - 44 IU/L  Lipid panel   Collection Time: 02/02/24 10:17 AM  Result Value Ref Range   Cholesterol, Total 157 100 - 199 mg/dL   Triglycerides 80 0 - 149 mg/dL   HDL 48 >60 mg/dL   VLDL Cholesterol Cal 15 5 - 40 mg/dL   LDL Chol Calc (NIH) 94 0 - 99 mg/dL   Chol/HDL Ratio 3.3 0.0 - 5.0 ratio    Assessment & Plan:   Problem List Items Addressed This Visit       Cardiovascular and Mediastinum   Hypertension - Primary   Relevant Medications   amLODipine  (NORVASC ) 10 MG tablet    Blood work looked good last time, blood pressure looks better today but he still getting a lot of diastolic in the 90s at home, will increase amlodipine  to 10 mg especially with his young age Follow up plan: Return in about 3 months (around 08/06/2024), or if symptoms worsen or fail to improve, for Hypertension recheck.  Counseling provided for all of the vaccine components No orders of the defined types were placed in this encounter.   Fonda Levins, MD [facility] 05/06/2024, 1:28 PM

## 2024-05-20 ENCOUNTER — Ambulatory Visit (INDEPENDENT_AMBULATORY_CARE_PROVIDER_SITE_OTHER)

## 2024-05-20 ENCOUNTER — Ambulatory Visit: Admitting: Family Medicine

## 2024-05-20 ENCOUNTER — Encounter: Payer: Self-pay | Admitting: Family Medicine

## 2024-05-20 VITALS — BP 138/92 | HR 90 | Ht 68.0 in | Wt 219.0 lb

## 2024-05-20 DIAGNOSIS — E781 Pure hyperglyceridemia: Secondary | ICD-10-CM | POA: Insufficient documentation

## 2024-05-20 DIAGNOSIS — M545 Low back pain, unspecified: Secondary | ICD-10-CM

## 2024-05-20 MED ORDER — DICLOFENAC SODIUM 1 % EX GEL: 2.0000 g | Freq: Four times a day (QID) | CUTANEOUS | 3 refills | Status: DC

## 2024-05-20 MED ORDER — METHYLPREDNISOLONE ACETATE 80 MG/ML IJ SUSP
80.0000 mg | Freq: Once | INTRAMUSCULAR | Status: AC
  Administered 2024-05-20: 80 mg via INTRAMUSCULAR

## 2024-05-20 NOTE — Progress Notes (Signed)
 BP (!) 138/92   Pulse 90   Ht 5' 8 (1.727 m)   Wt 219 lb (99.3 kg)   SpO2 97%   BMI 33.30 kg/m    Subjective:   Patient ID: Andrew Archer, male    DOB: 05/06/1991, 33 y.o.   MRN: [id number]  HPI: Andrew Archer is a 33 y.o. male presenting on 05/20/2024 for Back Pain   HPI Back pain Patient is coming in complaining of low back pain it has been bothering him over the past couple days.  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  He denies any numbness or weakness.  Patient went to the urgent care was given a Toradol shot and some muscle relaxers but he has not taken the muscle relaxants because they make him too sleepy.  Relevant past medical, surgical, family and social history reviewed and updated as indicated. Interim medical history since our last visit reviewed. Allergies and medications reviewed and updated.  Review of Systems  Constitutional:  Negative for chills and fever.  Respiratory:  Negative for shortness of breath and wheezing.   Cardiovascular:  Negative for chest pain and leg swelling.  Musculoskeletal:  Positive for arthralgias, back pain and myalgias. Negative for gait problem.  Skin:  Negative for rash.  All other systems reviewed and are negative.   Per HPI unless specifically indicated above   Allergies as of 05/20/2024   No Known Allergies      Medication List        Accurate as of May 20, 2024 11:11 AM. If you have any questions, ask your nurse or doctor.          amLODipine  10 MG tablet Commonly known as: NORVASC  Take 1 tablet (10 mg total) by mouth daily.   diclofenac  Sodium 1 % Gel Commonly known as: Voltaren  Apply 2 g topically 4 (four) times daily. Started by: Fonda LABOR Essica Kiker         Objective:   BP (!) 138/92   Pulse 90   Ht 5' 8 (1.727 m)   Wt 219 lb (99.3 kg)   SpO2  97%   BMI 33.30 kg/m   Wt Readings from Last 3 Encounters:  05/20/24 219 lb (99.3 kg)  05/06/24 219 lb (99.3 kg)  02/02/24 223 lb (101.2 kg)    Physical Exam Vitals and nursing note reviewed.  Constitutional:      General: He is not in acute distress.    Appearance: He is well-developed. He is not diaphoretic.  Eyes:     General: No scleral icterus.    Conjunctiva/sclera: Conjunctivae normal.  Neck:     Thyroid: No thyromegaly.  Musculoskeletal:        General: Normal range of motion.     Cervical back: Neck supple.     Lumbar back: Tenderness present. No lacerations or bony tenderness. Normal range of motion. Negative right straight leg raise test and negative left straight leg raise test.       Back:  Lymphadenopathy:     Cervical: No cervical adenopathy.  Skin:    General: Skin is warm and dry.     Findings: No rash.  Neurological:     Mental Status: He is alert and oriented to person, place, and time.     Coordination: Coordination normal.  Psychiatric:        Behavior: Behavior normal.     Lumbar x-ray: Old compression fracture in L1, no changes, no new fracture or bony abnormality.  Assessment & Plan:   Problem List Items Addressed This Visit   None Visit Diagnoses       Lumbar pain    -  Primary   Relevant Medications   methylPREDNISolone  acetate (DEPO-MEDROL ) injection 80 mg (Start on 05/20/2024 11:15 AM)   diclofenac  Sodium (VOLTAREN ) 1 % GEL   Other Relevant Orders   DG Lumbar Spine 2-3 Views       Likely muscular strain in origin, no new bony abnormalities.  Recommended Depo injection intramuscular and TENS unit and can take muscle relaxer as needed and also recommended that he use topical anti-inflammatory agent Follow up plan: Return if symptoms worsen or fail to improve.  Counseling provided for all of the vaccine components Orders Placed This Encounter  Procedures   DG Lumbar Spine 2-3 Views    Fonda Levins, MD [facility] 05/20/2024, 11:11 AM

## 2024-05-22 ENCOUNTER — Other Ambulatory Visit (HOSPITAL_COMMUNITY): Payer: Self-pay | Admitting: Otolaryngology

## 2024-05-22 ENCOUNTER — Ambulatory Visit (HOSPITAL_COMMUNITY)
Admission: RE | Admit: 2024-05-22 | Discharge: 2024-05-22 | Disposition: A | Discharge status: Home / Self Care | Source: Ambulatory Visit | Attending: Otolaryngology | Admitting: Otolaryngology

## 2024-05-22 DIAGNOSIS — M545 Low back pain, unspecified: Secondary | ICD-10-CM | POA: Diagnosis present

## 2024-05-23 ENCOUNTER — Ambulatory Visit: Payer: Self-pay | Admitting: Family Medicine

## 2024-07-15 ENCOUNTER — Encounter: Payer: Self-pay | Admitting: Family Medicine

## 2024-07-15 ENCOUNTER — Ambulatory Visit: Admitting: Family Medicine

## 2024-07-15 VITALS — BP 133/89 | HR 89 | Temp 98.0°F | Ht 68.0 in | Wt 220.0 lb

## 2024-07-15 DIAGNOSIS — I10 Essential (primary) hypertension: Secondary | ICD-10-CM

## 2024-07-15 NOTE — Progress Notes (Signed)
 BP 133/89   Pulse 89   Temp 98 F (36.7 C)   Ht 5' 8 (1.727 m)   Wt 220 lb (99.8 kg)   SpO2 98%   BMI 33.45 kg/m    Subjective:   Patient ID: Andrew Archer, male    DOB: [birth year], 33 y.o.   MRN: [id number]  HPI: Andrew Archer is a 33 y.o. male presenting on 07/15/2024 for Medical Management of Chronic Issues and Hypertension   Discussed the use of AI scribe software for clinical note transcription with the patient, who gave verbal consent to proceed.  History of Present Illness   Andrew Archer is a 33 year old male with hypertension who presents for a blood pressure recheck.  He has been monitoring his blood pressure at home, with systolic readings around 130 and diastolic values in the mid to lower 80s, successfully keeping the diastolic below 90.  He experiences no side effects from his current antihypertensive medication and reports no chest pain or shortness of breath. He reports that he feels the medication is doing its job.  He is working on reducing sodium and caffeine intake. He drinks coffee and has eliminated energy drinks from his diet, which previously included three to four energy drinks per day.          Relevant past medical, surgical, family and social history reviewed and updated as indicated. Interim medical history since our last visit reviewed. Allergies and medications reviewed and updated.  Review of Systems  Constitutional:  Negative for chills and fever.  Eyes:  Negative for visual disturbance.  Respiratory:  Negative for shortness of breath and wheezing.   Cardiovascular:  Negative for chest pain and leg swelling.  Musculoskeletal:  Negative for back pain and gait problem.  Skin:  Negative for rash.  Neurological:  Negative for dizziness and light-headedness.  All other systems reviewed and are negative.   Per HPI unless specifically indicated above   Allergies as of 07/15/2024   No Known Allergies      Medication List         Accurate as of July 15, 2024  1:04 PM. If you have any questions, ask your nurse or doctor.          amLODipine  10 MG tablet Commonly known as: NORVASC  Take 1 tablet (10 mg total) by mouth daily.   diclofenac  Sodium 1 % Gel Commonly known as: Voltaren  Apply 2 g topically 4 (four) times daily.         Objective:   BP 133/89   Pulse 89   Temp 98 F (36.7 C)   Ht 5' 8 (1.727 m)   Wt 220 lb (99.8 kg)   SpO2 98%   BMI 33.45 kg/m   Wt Readings from Last 3 Encounters:  07/15/24 220 lb (99.8 kg)  05/20/24 219 lb (99.3 kg)  05/06/24 219 lb (99.3 kg)    Physical Exam Physical Exam   CHEST: Lungs clear to auscultation. CARDIOVASCULAR: Regular heart sounds, no murmurs.         Assessment & Plan:   Problem List Items Addressed This Visit       Cardiovascular and Mediastinum   Hypertension - Primary        Essential hypertension Blood pressure improved but not optimal. No medication side effects reported. - Continue current antihypertensive medication. - Reduce caffeine intake to four cups daily. - Reduce sodium intake. - Monitor blood pressure regularly at home.  Follow up plan: Return in about 6 months (around 01/12/2025), or if symptoms worsen or fail to improve, for physical and Hypertension recheck.  Counseling provided for all of the vaccine components No orders of the defined types were placed in this encounter.   Fonda Levins, MD [facility] 07/15/2024, 1:04 PM

## 2024-08-05 ENCOUNTER — Other Ambulatory Visit (HOSPITAL_BASED_OUTPATIENT_CLINIC_OR_DEPARTMENT_OTHER): Payer: Self-pay

## 2024-08-21 ENCOUNTER — Telehealth: Payer: Self-pay

## 2024-08-21 NOTE — Telephone Encounter (Signed)
 Come in and be seen and we can eluate him and see what is going on.

## 2024-08-21 NOTE — Telephone Encounter (Signed)
 Pt complaining of bilateral leg pain for 2-3 weeks but last night left leg was much worse. Feels tight, denies pain.

## 2024-08-21 NOTE — Telephone Encounter (Signed)
 Spoke to wife, Luke, per signed DPR. Appointment scheduled.

## 2024-08-30 ENCOUNTER — Ambulatory Visit

## 2024-09-04 ENCOUNTER — Encounter: Payer: Self-pay | Admitting: Family Medicine

## 2024-09-04 ENCOUNTER — Other Ambulatory Visit (HOSPITAL_BASED_OUTPATIENT_CLINIC_OR_DEPARTMENT_OTHER): Payer: Self-pay

## 2024-09-04 ENCOUNTER — Ambulatory Visit: Admitting: Family Medicine

## 2024-09-04 VITALS — BP 127/90 | HR 91 | Ht 68.0 in | Wt 227.0 lb

## 2024-09-04 DIAGNOSIS — I10 Essential (primary) hypertension: Secondary | ICD-10-CM

## 2024-09-04 DIAGNOSIS — R600 Localized edema: Secondary | ICD-10-CM

## 2024-09-04 MED ORDER — HYDROCHLOROTHIAZIDE 25 MG PO TABS
25.0000 mg | ORAL_TABLET | Freq: Every day | ORAL | 3 refills | Status: DC
  Filled 2024-09-04: qty 90, 90d supply, fill #0

## 2024-09-04 NOTE — Progress Notes (Signed)
 BP (!) 127/90   Pulse 91   Ht 5' 8 (1.727 m)   Wt 227 lb (103 kg)   SpO2 98%   BMI 34.52 kg/m    Subjective:   Patient ID: Andrew Archer, male    DOB: Mar 09, 1991, 33 y.o.   MRN: [id number]  HPI: Andrew Archer is a 33 y.o. male presenting on 09/04/2024 for Leg Pain (And swelling. Bilateral ankles. Can have a rash on them. Started over 34m ago. Believes Amlodipine  may be causing)   Discussed the use of AI scribe software for clinical note transcription with the patient, who gave verbal consent to proceed.  History of Present Illness   The patient presents with ankle swelling, suspected to be related to amlodipine  use.  Peripheral edema - Ankle swelling present for approximately one month, onset after initiation of amlodipine  for blood pressure management - Swelling is more pronounced after prolonged standing, particularly during 13-hour work shifts - Edema improves with rest and elevation of the feet - No swelling in areas other than the ankles  Cutaneous findings - Non-pruritic, non-burning rashes on the ankles - Rashes resolve when ankle swelling decreases  Respiratory symptoms - No congestion or difficulty breathing  Weight gain - Weight gain attributed to dietary habits rather than fluid retention          Relevant past medical, surgical, family and social history reviewed and updated as indicated. Interim medical history since our last visit reviewed. Allergies and medications reviewed and updated.  Review of Systems  Constitutional:  Negative for chills and fever.  Eyes:  Negative for visual disturbance.  Respiratory:  Negative for shortness of breath and wheezing.   Cardiovascular:  Positive for leg swelling. Negative for chest pain.  Musculoskeletal:  Negative for back pain and gait problem.  Skin:  Negative for rash.  Neurological:  Negative for dizziness and light-headedness.  All other systems reviewed and are negative.   Per HPI unless specifically  indicated above   Allergies as of 09/04/2024   No Known Allergies      Medication List        Accurate as of September 04, 2024  9:22 AM. If you have any questions, ask your nurse or doctor.          STOP taking these medications    amLODipine  10 MG tablet Commonly known as: NORVASC  Stopped by: Fonda LABOR Taysia Rivere   diclofenac  Sodium 1 % Gel Commonly known as: Voltaren  Stopped by: Fonda LABOR Jacobo Moncrief       TAKE these medications    hydrochlorothiazide 25 MG tablet Commonly known as: HYDRODIURIL Take 1 tablet (25 mg total) by mouth daily. Started by: Fonda LABOR Sriya Kroeze         Objective:   BP (!) 127/90   Pulse 91   Ht 5' 8 (1.727 m)   Wt 227 lb (103 kg)   SpO2 98%   BMI 34.52 kg/m   Wt Readings from Last 3 Encounters:  09/04/24 227 lb (103 kg)  07/15/24 220 lb (99.8 kg)  05/20/24 219 lb (99.3 kg)    Physical Exam Vitals and nursing note reviewed.  Constitutional:      Appearance: Normal appearance.  Cardiovascular:     Rate and Rhythm: Normal rate and regular rhythm.     Heart sounds: No murmur heard. Neurological:     Mental Status: He is alert.    Physical Exam   VITALS: BP- 127/ NECK: Thyroid normal on palpation. CHEST:  Lungs clear to auscultation.         Assessment & Plan:   Problem List Items Addressed This Visit       Cardiovascular and Mediastinum   Hypertension - Primary   Relevant Medications   hydrochlorothiazide (HYDRODIURIL) 25 MG tablet   Other Relevant Orders   CMP14+EGFR   TSH   Other Visit Diagnoses       Peripheral edema       Relevant Medications   hydrochlorothiazide (HYDRODIURIL) 25 MG tablet   Other Relevant Orders   CMP14+EGFR   TSH          Peripheral edema likely secondary to amlodipine  Edema likely due to amlodipine , worsens with standing, improves with elevation. No pulmonary congestion or fluid overload. Renal or hepatic causes less likely. - Ordered kidney and liver function tests. -  Discontinued amlodipine . - Initiated hydrochlorothiazide once daily in the morning. - Advised to monitor blood pressure closely. - Instructed to ensure adequate hydration.  Hypertension Blood pressure well-controlled at 127 mmHg systolic. Transitioning from amlodipine  to hydrochlorothiazide due to side effects. - Initiated hydrochlorothiazide once daily in the morning. - Instructed to monitor blood pressure closely. - Advised to report significant changes in blood pressure or side effects.          Follow up plan: Return in about 6 months (around 03/04/2025), or if symptoms worsen or fail to improve, for htn recheck.  Counseling provided for all of the vaccine components Orders Placed This Encounter  Procedures   CMP14+EGFR   TSH    Fonda Levins, MD [facility] 09/04/2024, 9:22 AM

## 2024-09-05 LAB — CMP14+EGFR
ALT: 28 IU/L (ref 0–44)
AST: 24 IU/L (ref 0–40)
Albumin: 4.7 g/dL (ref 4.1–5.1)
Alkaline Phosphatase: 85 IU/L (ref 47–123)
BUN/Creatinine Ratio: 9 (ref 9–20)
BUN: 11 mg/dL (ref 6–20)
Bilirubin Total: 0.5 mg/dL (ref 0.0–1.2)
CO2: 24 mmol/L (ref 20–29)
Calcium: 9.9 mg/dL (ref 8.7–10.2)
Chloride: 100 mmol/L (ref 96–106)
Creatinine, Ser: 1.16 mg/dL (ref 0.76–1.27)
Globulin, Total: 1.8 g/dL (ref 1.5–4.5)
Glucose: 92 mg/dL (ref 70–99)
Potassium: 4.4 mmol/L (ref 3.5–5.2)
Sodium: 138 mmol/L (ref 134–144)
Total Protein: 6.5 g/dL (ref 6.0–8.5)
eGFR: 84 mL/min/1.73 (ref >59)

## 2024-09-05 LAB — TSH: TSH: 1.42 u[IU]/mL (ref 0.450–4.500)

## 2024-09-12 ENCOUNTER — Ambulatory Visit: Payer: Self-pay | Admitting: Family Medicine

## 2024-10-09 ENCOUNTER — Ambulatory Visit: Admitting: Family Medicine

## 2024-10-09 ENCOUNTER — Encounter: Payer: Self-pay | Admitting: Family Medicine

## 2024-10-09 ENCOUNTER — Other Ambulatory Visit: Payer: Self-pay

## 2024-10-09 ENCOUNTER — Other Ambulatory Visit (HOSPITAL_BASED_OUTPATIENT_CLINIC_OR_DEPARTMENT_OTHER): Payer: Self-pay

## 2024-10-09 VITALS — BP 150/105 | HR 89 | Temp 97.9°F | Ht 68.0 in | Wt 224.8 lb

## 2024-10-09 DIAGNOSIS — J069 Acute upper respiratory infection, unspecified: Secondary | ICD-10-CM

## 2024-10-09 DIAGNOSIS — J029 Acute pharyngitis, unspecified: Secondary | ICD-10-CM | POA: Diagnosis not present

## 2024-10-09 DIAGNOSIS — I10 Essential (primary) hypertension: Secondary | ICD-10-CM

## 2024-10-09 LAB — RAPID STREP SCREEN (MED CTR MEBANE ONLY): Strep Gp A Ag, IA W/Reflex: NEGATIVE

## 2024-10-09 LAB — CULTURE, GROUP A STREP

## 2024-10-09 MED ORDER — CHLORPHEN-PE-ACETAMINOPHEN 4-10-325 MG PO TABS
1.0000 | ORAL_TABLET | Freq: Four times a day (QID) | ORAL | 0 refills | Status: AC | PRN
  Filled 2024-10-09: qty 20, 5d supply, fill #0

## 2024-10-09 NOTE — Progress Notes (Signed)
 Acute Office Visit  Subjective:     Patient ID: Andrew Archer, male    DOB: 1991-02-15, 33 y.o.   MRN: [id number]  Chief Complaint  Patient presents with   Sore Throat    Sore Throat     History of Present Illness   Andrew Archer is a 33 year old male who presents with sore throat and voice loss.  Pharyngitis and dysphonia - Sore throat onset 4-5 days ago, worsening yesterday - Voice progressively worsened throughout the day, with significant impairment by last night - Persistent voice loss this morning - Requesting strep test today  Upper respiratory symptoms - Head congestion present for several days - Head pressure noted this morning - Fullness in ears without ear pain - No fever - No wheezing, shortness of breath, or trouble swallowing  Medication use and side effects - Using over-the-counter decongestants - Last dose taken at 6:45 AM today  Vital signs - Slightly elevated blood pressure today but is generally well controlled at home  Negative infectious disease testing - Two COVID-19 tests negative - Two influenza tests negative       ROS As per HPI.      Objective:    BP (!) 150/105   Pulse 89   Temp 97.9 F (36.6 C) (Temporal)   Ht 5' 8 (1.727 m)   Wt 224 lb 12.8 oz (102 kg)   SpO2 98%   BMI 34.18 kg/m    Physical Exam Vitals and nursing note reviewed.  Constitutional:      General: He is not in acute distress.    Appearance: He is not ill-appearing, toxic-appearing or diaphoretic.  HENT:     Right Ear: Ear canal normal. A middle ear effusion is present. Tympanic membrane is not erythematous.     Left Ear: Ear canal normal. A middle ear effusion is present. Tympanic membrane is not erythematous.     Nose: Congestion present.     Mouth/Throat:     Mouth: Mucous membranes are moist. No oral lesions.     Pharynx: Posterior oropharyngeal erythema present. No pharyngeal swelling, oropharyngeal exudate or uvula swelling.     Tonsils: No  tonsillar exudate or tonsillar abscesses. 1+ on the right. 1+ on the left.  Eyes:     Extraocular Movements:     Right eye: Normal extraocular motion.     Left eye: Normal extraocular motion.     Pupils: Pupils are equal, round, and reactive to light.  Cardiovascular:     Rate and Rhythm: Normal rate.     Heart sounds: Normal heart sounds. No murmur heard. Pulmonary:     Effort: Pulmonary effort is normal. No respiratory distress.     Breath sounds: Normal breath sounds. No wheezing, rhonchi or rales.  Musculoskeletal:     Cervical back: Neck supple.  Lymphadenopathy:     Cervical: No cervical adenopathy.  Skin:    General: Skin is dry.  Neurological:     General: No focal deficit present.     Mental Status: He is alert and oriented to person, place, and time.  Psychiatric:        Mood and Affect: Mood normal.        Behavior: Behavior normal.     No results found for any visits on 10/09/24.      Assessment & Plan:   Cove was seen today for sore throat.  Diagnoses and all orders for this visit:  Sore throat -  Rapid Strep Screen ([facility] ONLY)  Acute URI -     Chlorphen-PE-Acetaminophen  4-10-325 MG TABS; Take 1 tablet by mouth every 6 (six) hours as needed.  Primary hypertension  Assessment and Plan    Acute upper respiratory infection Discussed viral etiology. Negative strep today. Has had negative home Covid and flu tests.  - Prescribed Norel AD every 4-6 hours as needed. - Advised against additional acetaminophen  with Norel AD. - Instructed to report if no improvement by Friday for potential antibiotics.     Primary hypertension BP elevated today. Suspect due to decongestant use and illness. Monitor at home and notify for elevated readings.    Return if symptoms worsen or fail to improve.  The patient indicates understanding of these issues and agrees with the plan.  Annabella CHRISTELLA Search, FNP

## 2024-11-07 ENCOUNTER — Ambulatory Visit: Admitting: Family Medicine

## 2024-11-08 ENCOUNTER — Encounter: Payer: Self-pay | Admitting: Family Medicine

## 2024-11-08 ENCOUNTER — Other Ambulatory Visit (HOSPITAL_BASED_OUTPATIENT_CLINIC_OR_DEPARTMENT_OTHER): Payer: Self-pay

## 2024-11-08 ENCOUNTER — Ambulatory Visit

## 2024-11-08 VITALS — BP 125/81 | HR 86 | Ht 68.0 in | Wt 224.0 lb

## 2024-11-08 DIAGNOSIS — I10 Essential (primary) hypertension: Secondary | ICD-10-CM | POA: Diagnosis not present

## 2024-11-08 MED ORDER — LISINOPRIL-HYDROCHLOROTHIAZIDE 20-25 MG PO TABS
1.0000 | ORAL_TABLET | Freq: Every day | ORAL | 3 refills | Status: AC
  Filled 2024-11-08: qty 90, 90d supply, fill #0

## 2024-11-08 NOTE — Progress Notes (Signed)
 "  BP 125/81   Pulse 86   Ht 5' 8 (1.727 m)   Wt 224 lb (101.6 kg)   SpO2 98%   BMI 34.06 kg/m    Subjective:   Patient ID: Andrew Archer, male    DOB: September 11, 1991, 33 y.o.   MRN: [id number]  HPI: Andrew Archer is a 34 y.o. male presenting on 11/08/2024 for Medical Management of Chronic Issues and Hypertension   Discussed the use of AI scribe software for clinical note transcription with the patient, who gave verbal consent to proceed.  History of Present Illness   Andrew Archer is a 34 year old male with hypertension who presents with elevated blood pressure readings.  Hypertension - Persistent elevated blood pressure readings, typically ranging from 140s/150s over 102 to 108, with occasional spikes to 160s/110s - Diastolic pressure consistently above 90, often exceeding 100 - Elevated readings occur throughout the day, including upon waking, after breakfast, and during lunch - Home and work monitoring confirm elevated blood pressure, including a nurse-confirmed reading of 142/102 - No significant changes in symptoms or lifestyle that could account for elevated readings  Antihypertensive medication intolerance - Previously treated with amlodipine , which initially improved blood pressure - Developed swelling and rashes while on amlodipine , leading to discontinuation - Experienced abnormal sensations, feeling 'weird' and not like himself while on amlodipine   Current medication regimen - Currently taking hydrochlorothiazide  - No prior use of lisinopril   Associated symptoms - No headaches - No chest pain - No difficulty breathing          Relevant past medical, surgical, family and social history reviewed and updated as indicated. Interim medical history since our last visit reviewed. Allergies and medications reviewed and updated.  Review of Systems  Constitutional:  Negative for chills and fever.  Eyes:  Negative for visual disturbance.  Respiratory:  Negative for  shortness of breath and wheezing.   Cardiovascular:  Negative for chest pain and leg swelling.  Skin:  Negative for rash.  Neurological:  Negative for dizziness and numbness.  All other systems reviewed and are negative.   Per HPI unless specifically indicated above   Allergies as of 11/08/2024   No Known Allergies      Medication List        Accurate as of November 08, 2024 12:23 PM. If you have any questions, ask your nurse or doctor.          STOP taking these medications    hydrochlorothiazide  25 MG tablet Commonly known as: HYDRODIURIL  Stopped by: Fonda Levins, MD       TAKE these medications    lisinopril -hydrochlorothiazide  20-25 MG tablet Commonly known as: ZESTORETIC  Take 1 tablet by mouth daily. Started by: Fonda Levins, MD   Norel AD 4-10-325 MG Tabs Generic drug: Chlorphen-PE-Acetaminophen  Take 1 tablet by mouth every 6 (six) hours as needed.         Objective:   BP 125/81   Pulse 86   Ht 5' 8 (1.727 m)   Wt 224 lb (101.6 kg)   SpO2 98%   BMI 34.06 kg/m   Wt Readings from Last 3 Encounters:  11/08/24 224 lb (101.6 kg)  10/09/24 224 lb 12.8 oz (102 kg)  09/04/24 227 lb (103 kg)    Physical Exam Vitals and nursing note reviewed.  Constitutional:      Appearance: Normal appearance.  Neurological:     Mental Status: He is alert.    Physical Exam   CHEST:  Lungs clear to auscultation. CARDIOVASCULAR: Heart regular rate and rhythm.         Assessment & Plan:   Problem List Items Addressed This Visit       Cardiovascular and Mediastinum   Hypertension - Primary   Relevant Medications   lisinopril -hydrochlorothiazide  (ZESTORETIC ) 20-25 MG tablet       Essential (primary) hypertension Hypertension remains uncontrolled on hydrochlorothiazide  alone. Amlodipine  discontinued due to adverse effects. - Prescribed lisinopril  20 mg with hydrochlorothiazide  25 mg as a combination pill. - Scheduled follow-up in 1-2 months to  assess blood pressure control.          Follow up plan: Return if symptoms worsen or fail to improve, for 1 to 28-month hypertension recheck.  Counseling provided for all of the vaccine components No orders of the defined types were placed in this encounter.   Fonda Levins, MD [facility] 11/08/2024, 12:23 PM     "

## 2024-11-11 ENCOUNTER — Ambulatory Visit: Admitting: Family Medicine

## 2025-01-16 ENCOUNTER — Encounter: Payer: Self-pay | Admitting: Family Medicine

## 2025-03-05 ENCOUNTER — Ambulatory Visit: Admitting: Family Medicine
